# Patient Record
Sex: Female | Born: 1955 | Race: White | Hispanic: No | Marital: Single | Smoking: Current every day smoker
Health system: Southern US, Community
[De-identification: ages and names within clinical notes are randomized; demographics above are authoritative.]

## PROBLEM LIST (undated history)

## (undated) DIAGNOSIS — G43709 Chronic migraine without aura, not intractable, without status migrainosus: Secondary | ICD-10-CM

## (undated) DIAGNOSIS — IMO0002 Reserved for concepts with insufficient information to code with codable children: Secondary | ICD-10-CM

---
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
## 2010-08-05 DIAGNOSIS — C50919 Malignant neoplasm of unspecified site of unspecified female breast: Secondary | ICD-10-CM | POA: Insufficient documentation

## 2019-03-27 ENCOUNTER — Encounter: Payer: Self-pay | Admitting: Physician Assistant

## 2019-03-27 ENCOUNTER — Other Ambulatory Visit: Payer: Self-pay

## 2019-03-27 ENCOUNTER — Ambulatory Visit
Admission: EM | Admit: 2019-03-27 | Discharge: 2019-03-27 | Disposition: A | Discharge status: Home / Self Care | Payer: Medicare (Managed Care) | Attending: Physician Assistant | Admitting: Physician Assistant

## 2019-03-27 DIAGNOSIS — S90424A Blister (nonthermal), right lesser toe(s), initial encounter: Secondary | ICD-10-CM | POA: Diagnosis not present

## 2019-03-27 DIAGNOSIS — L03031 Cellulitis of right toe: Secondary | ICD-10-CM | POA: Diagnosis not present

## 2019-03-27 HISTORY — DX: Reserved for concepts with insufficient information to code with codable children: IMO0002

## 2019-03-27 MED ORDER — CEPHALEXIN 500 MG PO CAPS: 500.0000 mg | ORAL_CAPSULE | Freq: Four times a day (QID) | ORAL | 0 refills | Status: DC

## 2019-03-27 NOTE — Discharge Instructions (Addendum)
Start keflex  as directed. You can remove current dressing in 24 hours. Keep wound clean and dry. You can clean gently with soap and water. Do not soak area in water. Monitor for spreading redness, increased warmth, increased swelling, fever, follow up to ED for further evaluation needed.

## 2019-03-27 NOTE — ED Triage Notes (Signed)
Pt c/o pain with swelling to rt 3rd toe. States hit it on a stool 2 days ago. No has redness streaking from toe up foot.

## 2019-03-27 NOTE — ED Provider Notes (Signed)
[facility]    CSN: [id number] Arrival date & time: 03/27/19  1930      History   Chief Complaint Chief Complaint  Patient presents with  . Toe Pain    HPI Tracey Mitchell is a 63 y.o. female.   63 year old female comes in for evaluation of right third toe pain and swelling.  States hit the toe 2 days ago with mild pain.  Distal toe was slightly erythematous.  However, throughout the past 2 days, has had increased swelling, blister, and now with streaking up the leg.  She has had increased pain to the toe without trouble moving.  States was able to self drain purulent drainage from the blister.  She denies any fever, chills, night sweats.  She has not taken anything for the symptoms.  Patient with history of breast cancer in remission.  History of osteoporosis.  Denies history of MRSA.     Past Medical History:  Diagnosis Date  . Chronic migraine     There are no active problems to display for this patient.   History reviewed. No pertinent surgical history.  OB History   No obstetric history on file.      Home Medications    Prior to Admission medications   Medication Sig Start Date End Date Taking? Authorizing Provider  cephALEXin (KEFLEX) 500 MG capsule Take 1 capsule (500 mg total) by mouth 4 (four) times daily. 03/27/19   Ok Edwards, PA-C    Family History No family history on file.  Social History Social History   Tobacco Use  . Smoking status: Current Every Day Smoker  . Smokeless tobacco: Never Used  Substance Use Topics  . Alcohol use: Not Currently  . Drug use: Not on file     Allergies   Morphine and related and Sulfa antibiotics   Review of Systems Review of Systems  Reason unable to perform ROS: See HPI as above.     Physical Exam Triage Vital Signs ED Triage Vitals  Enc Vitals Group     BP 03/27/19 1939 131/85     Pulse Rate 03/27/19 1939 95     Resp 03/27/19 1939 16     Temp 03/27/19 1939 98.3 F (36.8 C)     Temp  Source 03/27/19 1939 Oral     SpO2 03/27/19 1939 96 %     Weight --      Height --      Head Circumference --      Peak Flow --      Pain Score 03/27/19 1944 6     Pain Loc --      Pain Edu? --      Excl. in [place]? --    No data found.  Updated Vital Signs BP 131/85 (BP Location: Left Arm)   Pulse 95   Temp 98.3 F (36.8 C) (Oral)   Resp 16   SpO2 96%   Physical Exam Constitutional:      General: She is not in acute distress.    Appearance: She is well-developed. She is not diaphoretic.  HENT:     Head: Normocephalic and atraumatic.  Eyes:     Conjunctiva/sclera: Conjunctivae normal.     Pupils: Pupils are equal, round, and reactive to light.  Musculoskeletal:     Comments: Swelling, erythema, contusion to the distal third toe with blister near the nailbed. Possible purulent fluid in blister. Erythema streaking up dorsal foot, to mid shin. Tenderness to palpation of  distal toe.  Full range of motion of toe. Sensation intact. Pedal pulse 2+.   Neurological:     Mental Status: She is alert and oriented to person, place, and time.     UC Treatments / Results  Labs (all labs ordered are listed, but only abnormal results are displayed) Labs Reviewed - No data to display  EKG   Radiology No results found.  Procedures Incision and Drainage  Date/Time: 03/27/2019 10:06 PM Performed by: Ok Edwards, PA-C Authorized by: Ok Edwards, PA-C   Consent:    Consent obtained:  Verbal   Consent given by:  Patient   Risks discussed:  Bleeding, incomplete drainage, pain and infection   Alternatives discussed:  Alternative treatment Location:    Type:  Bulla   Size:  1cm x 0.5cm   Location:  Lower extremity   Lower extremity location:  Toe   Toe location:  R third toe Pre-procedure details:    Skin preparation:  Hibiclens Anesthesia (see MAR for exact dosages):    Anesthesia method: freezing spray. Procedure type:    Complexity:  Simple Procedure details:    Needle  aspiration: no     Incision types:  Single straight   Incision depth:  Dermal   Scalpel blade:  11   Wound management:  Irrigated with saline   Drainage:  Serosanguinous and purulent   Drainage amount:  Scant   Wound treatment:  Wound left open   Packing materials:  None Post-procedure details:    Patient tolerance of procedure:  Tolerated well, no immediate complications   (including critical care time)  Medications Ordered in UC Medications - No data to display  Initial Impression / Assessment and Plan / UC Course  I have reviewed the triage vital signs and the nursing notes.  Pertinent labs & imaging results that were available during my care of the patient were reviewed by me and considered in my medical decision making (see chart for details).    Patient would like to defer xray as she has had toe fractures in the past, and knows treatment options. Given does not involve great toe, ok to defer xray.  Patient tolerated procedure well. Start keflex for surrounding cellulitis. Wound care instructions given. Return precautions given. Patient expresses understanding and agrees to plan.   Final Clinical Impressions(s) / UC Diagnoses   Final diagnoses:  Cellulitis of toe of right foot   ED Prescriptions    Medication Sig Dispense Auth. Provider   cephALEXin (KEFLEX) 500 MG capsule Take 1 capsule (500 mg total) by mouth 4 (four) times daily. 28 capsule Tobin Chad, [place] 03/27/19 2210

## 2019-06-17 DIAGNOSIS — M810 Age-related osteoporosis without current pathological fracture: Secondary | ICD-10-CM | POA: Diagnosis not present

## 2019-06-17 DIAGNOSIS — G43909 Migraine, unspecified, not intractable, without status migrainosus: Secondary | ICD-10-CM | POA: Diagnosis not present

## 2019-06-17 DIAGNOSIS — R69 Illness, unspecified: Secondary | ICD-10-CM | POA: Diagnosis not present

## 2019-06-17 DIAGNOSIS — S90511A Abrasion, right ankle, initial encounter: Secondary | ICD-10-CM | POA: Diagnosis not present

## 2019-06-17 DIAGNOSIS — Z79899 Other long term (current) drug therapy: Secondary | ICD-10-CM | POA: Diagnosis not present

## 2019-06-17 DIAGNOSIS — Z6824 Body mass index (BMI) 24.0-24.9, adult: Secondary | ICD-10-CM | POA: Diagnosis not present

## 2019-06-17 DIAGNOSIS — Z23 Encounter for immunization: Secondary | ICD-10-CM | POA: Diagnosis not present

## 2019-06-17 DIAGNOSIS — Z853 Personal history of malignant neoplasm of breast: Secondary | ICD-10-CM | POA: Diagnosis not present

## 2019-07-14 DIAGNOSIS — Z853 Personal history of malignant neoplasm of breast: Secondary | ICD-10-CM | POA: Diagnosis not present

## 2019-07-28 DIAGNOSIS — L905 Scar conditions and fibrosis of skin: Secondary | ICD-10-CM | POA: Diagnosis not present

## 2019-07-28 DIAGNOSIS — N644 Mastodynia: Secondary | ICD-10-CM | POA: Diagnosis not present

## 2019-07-28 DIAGNOSIS — C50912 Malignant neoplasm of unspecified site of left female breast: Secondary | ICD-10-CM | POA: Diagnosis not present

## 2019-07-28 DIAGNOSIS — R29818 Other symptoms and signs involving the nervous system: Secondary | ICD-10-CM | POA: Diagnosis not present

## 2019-08-04 DIAGNOSIS — N644 Mastodynia: Secondary | ICD-10-CM | POA: Diagnosis not present

## 2019-08-04 DIAGNOSIS — L905 Scar conditions and fibrosis of skin: Secondary | ICD-10-CM | POA: Diagnosis not present

## 2019-08-04 DIAGNOSIS — C50912 Malignant neoplasm of unspecified site of left female breast: Secondary | ICD-10-CM | POA: Diagnosis not present

## 2019-08-04 DIAGNOSIS — R29818 Other symptoms and signs involving the nervous system: Secondary | ICD-10-CM | POA: Diagnosis not present

## 2019-08-06 DIAGNOSIS — L905 Scar conditions and fibrosis of skin: Secondary | ICD-10-CM | POA: Diagnosis not present

## 2019-08-06 DIAGNOSIS — N644 Mastodynia: Secondary | ICD-10-CM | POA: Diagnosis not present

## 2019-08-06 DIAGNOSIS — R29818 Other symptoms and signs involving the nervous system: Secondary | ICD-10-CM | POA: Diagnosis not present

## 2019-08-06 DIAGNOSIS — C50912 Malignant neoplasm of unspecified site of left female breast: Secondary | ICD-10-CM | POA: Diagnosis not present

## 2019-08-08 DIAGNOSIS — C50912 Malignant neoplasm of unspecified site of left female breast: Secondary | ICD-10-CM | POA: Diagnosis not present

## 2019-08-08 DIAGNOSIS — R29818 Other symptoms and signs involving the nervous system: Secondary | ICD-10-CM | POA: Diagnosis not present

## 2019-08-08 DIAGNOSIS — L905 Scar conditions and fibrosis of skin: Secondary | ICD-10-CM | POA: Diagnosis not present

## 2019-08-08 DIAGNOSIS — N644 Mastodynia: Secondary | ICD-10-CM | POA: Diagnosis not present

## 2019-08-11 DIAGNOSIS — C50912 Malignant neoplasm of unspecified site of left female breast: Secondary | ICD-10-CM | POA: Diagnosis not present

## 2019-08-11 DIAGNOSIS — N644 Mastodynia: Secondary | ICD-10-CM | POA: Diagnosis not present

## 2019-08-11 DIAGNOSIS — R29818 Other symptoms and signs involving the nervous system: Secondary | ICD-10-CM | POA: Diagnosis not present

## 2019-08-11 DIAGNOSIS — L905 Scar conditions and fibrosis of skin: Secondary | ICD-10-CM | POA: Diagnosis not present

## 2019-08-13 DIAGNOSIS — R29818 Other symptoms and signs involving the nervous system: Secondary | ICD-10-CM | POA: Diagnosis not present

## 2019-08-13 DIAGNOSIS — N644 Mastodynia: Secondary | ICD-10-CM | POA: Diagnosis not present

## 2019-08-13 DIAGNOSIS — L905 Scar conditions and fibrosis of skin: Secondary | ICD-10-CM | POA: Diagnosis not present

## 2019-08-13 DIAGNOSIS — C50912 Malignant neoplasm of unspecified site of left female breast: Secondary | ICD-10-CM | POA: Diagnosis not present

## 2019-08-15 DIAGNOSIS — L905 Scar conditions and fibrosis of skin: Secondary | ICD-10-CM | POA: Diagnosis not present

## 2019-08-15 DIAGNOSIS — N644 Mastodynia: Secondary | ICD-10-CM | POA: Diagnosis not present

## 2019-08-15 DIAGNOSIS — C50912 Malignant neoplasm of unspecified site of left female breast: Secondary | ICD-10-CM | POA: Diagnosis not present

## 2019-08-15 DIAGNOSIS — R29818 Other symptoms and signs involving the nervous system: Secondary | ICD-10-CM | POA: Diagnosis not present

## 2019-09-12 DIAGNOSIS — Z791 Long term (current) use of non-steroidal anti-inflammatories (NSAID): Secondary | ICD-10-CM | POA: Diagnosis not present

## 2019-09-12 DIAGNOSIS — Z720 Tobacco use: Secondary | ICD-10-CM | POA: Diagnosis not present

## 2019-09-12 DIAGNOSIS — Z79891 Long term (current) use of opiate analgesic: Secondary | ICD-10-CM | POA: Diagnosis not present

## 2019-09-12 DIAGNOSIS — R32 Unspecified urinary incontinence: Secondary | ICD-10-CM | POA: Diagnosis not present

## 2019-09-12 DIAGNOSIS — R69 Illness, unspecified: Secondary | ICD-10-CM | POA: Diagnosis not present

## 2019-09-12 DIAGNOSIS — I10 Essential (primary) hypertension: Secondary | ICD-10-CM | POA: Diagnosis not present

## 2019-09-12 DIAGNOSIS — G43909 Migraine, unspecified, not intractable, without status migrainosus: Secondary | ICD-10-CM | POA: Diagnosis not present

## 2019-09-12 DIAGNOSIS — Z803 Family history of malignant neoplasm of breast: Secondary | ICD-10-CM | POA: Diagnosis not present

## 2019-09-12 DIAGNOSIS — G4700 Insomnia, unspecified: Secondary | ICD-10-CM | POA: Diagnosis not present

## 2019-09-12 DIAGNOSIS — G8929 Other chronic pain: Secondary | ICD-10-CM | POA: Diagnosis not present

## 2019-09-16 DIAGNOSIS — R69 Illness, unspecified: Secondary | ICD-10-CM | POA: Diagnosis not present

## 2019-09-23 DIAGNOSIS — R69 Illness, unspecified: Secondary | ICD-10-CM | POA: Diagnosis not present

## 2019-09-23 DIAGNOSIS — Z6824 Body mass index (BMI) 24.0-24.9, adult: Secondary | ICD-10-CM | POA: Diagnosis not present

## 2019-09-23 DIAGNOSIS — G43909 Migraine, unspecified, not intractable, without status migrainosus: Secondary | ICD-10-CM | POA: Diagnosis not present

## 2019-09-23 DIAGNOSIS — I10 Essential (primary) hypertension: Secondary | ICD-10-CM | POA: Diagnosis not present

## 2020-04-12 DIAGNOSIS — C50912 Malignant neoplasm of unspecified site of left female breast: Secondary | ICD-10-CM

## 2021-02-16 ENCOUNTER — Other Ambulatory Visit: Payer: Self-pay | Admitting: Hematology and Oncology

## 2021-02-17 ENCOUNTER — Other Ambulatory Visit: Payer: Self-pay | Admitting: Hematology and Oncology

## 2021-02-17 ENCOUNTER — Inpatient Hospital Stay: Payer: Medicare Other | Attending: Hematology and Oncology | Admitting: Hematology and Oncology

## 2021-02-17 DIAGNOSIS — M899 Disorder of bone, unspecified: Secondary | ICD-10-CM

## 2021-02-17 MED ORDER — IBUPROFEN 800 MG PO TABS: 800.0000 mg | ORAL_TABLET | Freq: Four times a day (QID) | ORAL | 3 refills | Status: DC | PRN

## 2021-02-17 NOTE — Progress Notes (Addendum)
[facility]  [address] [place],  [place]  [address] [phone]  Clinic Day:  02/17/2021  Referring physician: Cyndi Bender, PA-C I connected with  Tracey Mitchell on 02/17/21 by telephone and verified that I am speaking with the correct person using two identifiers.   I discussed the limitations of evaluation and management by telemedicine. The patient expressed understanding and agreed to proceed.   CHIEF COMPLAINT:  CC: A 65 year old female with history of stage IIA breast cancer with newly found femur lesion for evaluation.  Current Treatment:  Diagnostics   HISTORY OF PRESENT ILLNESS:  Tracey Mitchell is a 65 y.o. 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 This was treated with left mastectomy in January 2012, and adjuvant radiation and Arimidex for a total of 4.5 years.  She received Zometa during this time as well due to severe bone loss.  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  She then underwent a left TRAM flap reconstruction, and the scar tissue from this surgery has damaged her nerves, and causes her severe pain.  She states that the pain has progressively worsened over the past 4-5 months, but is episodic, and lasts 7-10 minutes.  She notices increased frequency and duration of this pain.  She has been tried on gabapentin, and pregabalin but these were not tolerated.  She began menarche and age 89, she has had 7 pregnancies with 2 live births, and had her first child at age 72.  She has a hysterectomy at age 46, but ovaries are intact.  She was not placed on hormone replacement therapy. She has severe chronic migraines, up to 10/10, and is on multiple medications to treat this.    Bone density scan from June revealed improved osteopenia with a T-score of -2.2 of the left femur, previously -2.7.  The AP spine measures -2.1, previously -2.4.   She has not received any treatment, but is taking [employer] collagen, oral vitamin D, and a daily multivitamin.  She underwent annual mammogram and ultrasound on July 8th which was clear.   INTERVAL HISTORY:  Tracey Mitchell is being evaluated today due to new findings on imaging. She states she has been followed regularly for a history of lung nodules. On her most recent CT of the chest on 01/11/2021, they noted some changes. IMPRESSION: 1. No acute intrathoracic pathology. 2. Multiple bilateral ground-glass nodules relatively similar to prior CT. Multidisciplinary consult and further evaluation with PET-CT is recommended 3. Aortic Atherosclerosis (ICD10-I70.0).   At this time, PET was obtained for further evaluation. This revealed IMPRESSION: 1. Multiple small ground-glass opacities in the lungs bilaterally is seen on previous diagnostic chest CTs. None of these demonstrate hypermetabolism although all ower below the accepted threshold for reliable resolution on PET imaging. Additionally, well differentiated/low-grade neoplasm can be poorly FDG avid in continued close attention recommended. 2. 1.7 cm lucent and sclerotic lesion in the right femoral neck is hypermetabolic. As a metastatic deposit cannot be excluded, right hip MRI with and without contrast recommended to further evaluate. 3. Nonspecific uptake along the posterior left third rib. Continued attention to this region on follow-up recommended.   MRI of the hip was then obtained and revealed IMPRESSION: 1. Enhancing marrow replacing bone lesion located eccentrically within the intertrochanteric aspect of the right femur anteriorly measuring up to 2.0 cm. Findings are highly suspicious for bone neoplasm such as metastatic  disease or myeloma. Tissue sampling will likely be required for definitive diagnosis. 2. Linear low signal intensity component within the lesion is suspicious for developing pathologic fracture. 3. No additional marrow replacing bone  lesions. 4. Mild osteoarthritis of the right hip.  Today the patient reports she has not noted any pain to that hip and is very anxious to find out what it is. She denies any changes to her breasts or concerns for her breast disease to have returned. She has noted a nonproductive cough with some shortness of breath upon exertion. She denies chest pain. She denies fever, chills, nausea or vomiting. She denies issue with bowel or bladder. Her appetite is good and she states her weight has been stable.   REVIEW OF SYSTEMS:  Review of Systems  Constitutional: Negative for appetite change, chills, diaphoresis, fatigue, fever and unexpected weight change.  HENT:   Negative for hearing loss, lump/mass, mouth sores, nosebleeds, sore throat, tinnitus, trouble swallowing and voice change.   Eyes: Negative for eye problems and icterus.  Respiratory: Positive for cough and shortness of breath. Negative for chest tightness, hemoptysis and wheezing.   Cardiovascular: Negative for chest pain, leg swelling and palpitations.  Gastrointestinal: Negative for abdominal distention, abdominal pain, blood in stool, constipation, diarrhea, nausea, rectal pain and vomiting.  Endocrine: Negative for hot flashes.  Genitourinary: Negative for bladder incontinence, difficulty urinating, dyspareunia, dysuria, frequency, hematuria and nocturia.   Musculoskeletal: Negative for arthralgias, back pain, flank pain, gait problem, myalgias, neck pain and neck stiffness.  Skin: Negative for itching, rash and wound.  Neurological: Negative for dizziness, extremity weakness, gait problem, headaches, light-headedness, numbness, seizures and speech difficulty.  Hematological: Negative for adenopathy. Does not bruise/bleed easily.  Psychiatric/Behavioral: Negative for confusion, decreased concentration, depression, sleep disturbance and suicidal ideas. The patient is not nervous/anxious.      VITALS:  There were no vitals taken for  this visit.  Wt Readings from Last 3 Encounters:  No data found for Wt    There is no height or weight on file to calculate BMI.  Performance status (ECOG): 1 - Symptomatic but completely ambulatory  PHYSICAL EXAM:  Physical Exam  LABS:  No flowsheet data found. No flowsheet data found.   No results found for: CEA1 / No results found for: CEA1 No results found for: PSA1 No results found for: NID782 No results found for: CAN125  No results found for: TOTALPROTELP, ALBUMINELP, A1GS, A2GS, BETS, BETA2SER, GAMS, MSPIKE, SPEI No results found for: TIBC, FERRITIN, IRONPCTSAT No results found for: LDH  STUDIES:  Exam(s): [id number] CT/CT CHEST W/O CM CLINICAL DATA:  65 year old female with pulmonary nodule follow-up. Shortness of breath and cough.  EXAM: CT CHEST WITHOUT CONTRAST  TECHNIQUE: Multidetector CT imaging of the chest was performed following the standard protocol without IV contrast.  COMPARISON:  Chest CT dated 10/08/2020.  FINDINGS: Evaluation of this exam is limited in the absence of intravenous contrast.  Cardiovascular: There is no cardiomegaly or pericardial effusion. There is mild atherosclerotic calcification of the thoracic aorta. No aneurysmal dilatation. The central pulmonary arteries are grossly unremarkable on this noncontrast CT.  Mediastinum/Nodes: No hilar or mediastinal adenopathy. The esophagus and the thyroid gland are grossly unremarkable. No mediastinal fluid collection.  Lungs/Pleura: Scattered ground-glass nodules in the right upper lobe relatively similar to prior CT. The largest nodule measuring approximately 8 mm (previously 9 mm). Additional faint ground-glass nodule noted in the left upper lobe measuring approximately 4 mm (44/4) similar to prior CT. Multiple small  nodules in the right lower lobe also similar to prior CT including a 6 mm ground-glass nodule (77/4). No new consolidation. There is no pleural effusion pneumothorax.  The central airways are patent.  Upper Abdomen: Minimal thickening of the left adrenal gland similar to prior CT.  Musculoskeletal: Several biopsy clips in the left breast. Acute osseous pathology. No suspicious bone lesions.  IMPRESSION: 1. No acute intrathoracic pathology. 2. Multiple bilateral ground-glass nodules relatively similar to prior CT. Multidisciplinary consult and further evaluation with PET-CT is recommended. 3. Aortic Atherosclerosis (ICD10-I70.0).   Electronically Signed   By: Anner Crete M.D.   On: 01/12/2021 00:39  Exam(s): [id number] NM/PET SKULL BASE TO MID-THIGH CLINICAL DATA:  Initial treatment strategy for lung nodule.  EXAM: NUCLEAR MEDICINE PET SKULL BASE TO THIGH  TECHNIQUE: 13.5 mCi F-18 FDG was injected intravenously. Full-ring PET imaging was performed from the skull base to thigh after the radiotracer. CT data was obtained and used for attenuation correction and anatomic localization.  Fasting blood glucose: 112 mg/dl  COMPARISON:  Chest CT 01/11/2021  FINDINGS: Mediastinal blood pool activity: SUV max 3.2  Liver activity: SUV max NA  NECK: No hypermetabolic lymph nodes in the neck.  Incidental CT findings: none  CHEST: Multiple scattered tiny ground-glass opacities are noted in the lungs bilaterally, better demonstrated on previous diagnostic chest CTs. One of the more dominant/confluent nodules in the posterior right upper lobe measures 7 mm today (image 67/3) with SUV max = 0.9. no hypermetabolic lymphadenopathy in the chest.  Incidental CT findings: Atherosclerotic calcification is noted in the wall of the thoracic aorta.  ABDOMEN/PELVIS: No abnormal hypermetabolic activity within the liver, pancreas, adrenal glands, or spleen. No hypermetabolic lymph nodes in the abdomen or pelvis.  Incidental CT findings: There is abdominal aortic atherosclerosis without aneurysm.  SKELETON: Focal uptake identified in the lateral  supraspinatus muscle bilaterally, likely physiologic. There is noted to be a focus of hypermetabolism in the right femoral neck corresponding to a 1.7 cm lucent and sclerotic lesion visible on image 263 of series 3 demonstrating SUV max = 4.1. There is some uptake posteriorly along the left third rib without a discrete rib lesion evident.  Incidental CT findings: none  IMPRESSION: 1. Multiple small ground-glass opacities in the lungs bilaterally is seen on previous diagnostic chest CTs. None of these demonstrate hypermetabolism although all ower below the accepted threshold for reliable resolution on PET imaging. Additionally, well differentiated/low-grade neoplasm can be poorly FDG avid in continued close attention recommended. 2. 1.7 cm lucent and sclerotic lesion in the right femoral neck is hypermetabolic. As a metastatic deposit cannot be excluded, right hip MRI with and without contrast recommended to further evaluate. 3. Nonspecific uptake along the posterior left third rib. Continued attention to this region on follow-up recommended.   Electronically Signed   By: Misty Stanley M.D.   On: 01/31/2021 16:05  Electronically Signed By: Eugenie Norrie MD  Electronically Signed Date/Time: 05/09/221608 Dictate Date/Time: 01/31/21 1554  Exam(s): [id number] MRI/MRI HIP RIGHT W/WO CLINICAL DATA:  Hypermetabolic bone lesion in the right femoral neck incidentally discovered on PET-CT  EXAM: MRI OF THE RIGHT HIP WITHOUT AND WITH CONTRAST  TECHNIQUE: Multiplanar, multisequence MR imaging was performed both before and after administration of intravenous contrast.  CONTRAST:  6 mL Gadavist IV contrast  COMPARISON:  PET-CT 01/31/2021  FINDINGS: Bones/Joint/Cartilage  There is a rounded T1 hypointense, T2 heterogeneously hyperintense marrow replacing bone lesion located eccentrically within the intertrochanteric aspect of the right femur anteriorly  measuring approximately 2.0  x 1.9 x 1.0 cm (series 4, image 20; series 11, image 16). Lesion enhances on postcontrast sequences. Linear low T1/T2 signal intensity component within the lesion is suspicious for developing pathologic fracture (series 8, image 17). Overlying cortex is preserved. No extraosseous soft tissue component.  Remaining osseous structures appear within normal limits. No additional sites of fracture. No dislocation. No femoral head avascular necrosis. No additional marrow replacing bone lesions. Mild osteoarthritis of the right hip with subcortical marrow edema within the superior acetabulum. No hip joint effusion.  Ligaments  Intact.  Muscles and Tendons  The gluteal, hamstring, iliopsoas, rectus femoris, and adductor tendons appear intact without tear or significant tendinosis. Normal muscle bulk and signal intensity without edema, atrophy, or fatty infiltration.  Soft tissues  No soft tissue edema or fluid collection. No inguinal lymphadenopathy. No acute findings within the pelvis.  IMPRESSION: 1. Enhancing marrow replacing bone lesion located eccentrically within the intertrochanteric aspect of the right femur anteriorly measuring up to 2.0 cm. Findings are highly suspicious for bone neoplasm such as metastatic disease or myeloma. Tissue sampling will likely be required for definitive diagnosis. 2. Linear low signal intensity component within the lesion is suspicious for developing pathologic fracture. 3. No additional marrow replacing bone lesions. 4. Mild osteoarthritis of the right hip.  These results will be called to the ordering clinician or representative by the Radiologist Assistant, and communication documented in the PACS or [employer].   Electronically Signed   By: Davina Poke D.O.   On: 02/10/2021 10:35  Electronically Signed By: Loura Halt DO  Electronically Signed Date/Time: 05/19/221038 Dictate Date/Time: 02/10/21 0959   HISTORY:    Past Medical History:  Diagnosis Date  . Chronic migraine     No past surgical history on file.  No family history on file.  Social History:  reports that she has been smoking. She has never used smokeless tobacco. She reports previous alcohol use. No history on file for drug use.The patient is alone  today.  Allergies:  Allergies  Allergen Reactions  . Morphine And Related   . Sulfa Antibiotics     Current Medications: Current Outpatient Medications  Medication Sig Dispense Refill  . cephALEXin (KEFLEX) 500 MG capsule Take 1 capsule (500 mg total) by mouth 4 (four) times daily. 28 capsule 0   No current facility-administered medications for this visit.     ASSESSMENT & PLAN:   Assessment:  Tracey Mitchell is a 65 y.o. female with newly found femur lesion which is PET positive. This was found incidentally while evaluating lung nodules, for which the patient has a history. She also has a remote history of stage IIA breast cancer diagnosed in 2011 that was treated with left breast mastectomy and adjuvant radiation followed by 4.5 years of arimidex therapy. She is agreeable to further diagnostic evaluation of the femur lesion including biopsy whether with IR or orthopedic surgery.  Plan: After speaking with Dr. Kathlene Cote with [facility], it has been determined that the safer route for the patient would be to pursue further evaluation with orthopedic oncology. We will refer the patient to Dr. Magda Bernheim at [facility].   She verbalizes understanding of and agreement to the plans discussed today. She knows to call the office should any new questions or concerns arise.      Melodye Ped, NP    Patient: Home Provider: Office Time spent on telephone visit: 30 minutes

## 2021-03-03 DIAGNOSIS — Z853 Personal history of malignant neoplasm of breast: Secondary | ICD-10-CM | POA: Insufficient documentation

## 2021-06-01 ENCOUNTER — Telehealth: Payer: Self-pay | Admitting: Oncology

## 2021-06-01 NOTE — Telephone Encounter (Signed)
Per 8/26 Staff Msg, patient scheduled for 9/9 Follow Up Appt

## 2021-06-03 ENCOUNTER — Inpatient Hospital Stay: Payer: Medicare (Managed Care) | Admitting: Oncology

## 2021-06-03 ENCOUNTER — Encounter: Payer: Self-pay | Admitting: Oncology

## 2021-06-03 DIAGNOSIS — M8580 Other specified disorders of bone density and structure, unspecified site: Secondary | ICD-10-CM | POA: Insufficient documentation

## 2021-06-13 NOTE — Progress Notes (Signed)
[facility]  [address] [place],  [place]  [address] [phone]  Clinic Day:  06/20/2021  Referring physician: Darrol Jump, NP   This document serves as a record of services personally performed by Hosie Poisson, MD. It was created on their behalf by Curry,Lauren E, a trained medical scribe. The creation of this record is based on the scribe's personal observations and the provider's statements to them.  CHIEF COMPLAINT:  CC: History of stage IIA breast cancer with newly found femur lesion for evaluation.  Current Treatment:  Diagnostics   HISTORY OF PRESENT ILLNESS:  Tracey Mitchell is a 65 y.o. 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 This was treated with left mastectomy in January 2012, and adjuvant radiation and Arimidex for a total of 4.5 years.  She received Zometa during this time as well due to severe bone loss.  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  She then underwent a left TRAM flap reconstruction, and the scar tissue from this surgery has damaged her nerves, and causes her severe pain.  She states that the pain has progressively worsened over the past 4-5 months, but is episodic, and lasts 7-10 minutes.  She notices increased frequency and duration of this pain.  She has been tried on gabapentin, and pregabalin but these were not tolerated.  She began menarche and age 73, she has had 7 pregnancies with 2 live births, and had her first child at age 47.  She has a hysterectomy at age 3, but ovaries are intact.  She was not placed on hormone replacement therapy. She has severe chronic migraines, up to 10/10, and is on multiple medications to treat this.    Bone density scan from June 2021 revealed improved osteopenia with a T-score of -2.2 of the left femur, previously -2.7.  The AP spine measures -2.1, previously -2.4.  She  received Zometa while on anastrozole, but is now taking [employer] collagen, oral vitamin D, and a daily multivitamin.    CT chest from April 2022 revealed no acute intrathoracic pathology. Multiple bilateral ground-glass nodules relatively similar to prior CT. Multidisciplinary consult and further evaluation with PET-CT is recommended. PET was obtained for further evaluation. This revealed multiple small ground-glass opacities in the lungs bilaterally is seen on previous diagnostic chest CTs. None of these demonstrate hypermetabolism although all ower below the accepted threshold for reliable resolution on PET imaging. Additionally, well differentiated/low-grade neoplasm can be poorly FDG avid in continued close attention recommended. A 1.7 cm lucent and sclerotic lesion in the right femoral neck is hypermetabolic. As a metastatic deposit cannot be excluded, right hip MRI with and without contrast recommended to further evaluate. Nonspecific uptake along the posterior left third rib. Continued attention to this region on follow-up recommended. MRI of the hip was then obtained and revealed enhancing marrow replacing bone lesion located eccentrically within the intertrochanteric aspect of the right femur anteriorly measuring up to 2.0 cm. Linear low signal intensity component within the lesion is suspicious for developing pathologic fracture. No additional marrow replacing bone lesions. Mild osteoarthritis of the right hip. She was evaluated by Dr. Magda Bernheim, orthopedic oncologist at [facility]. He feels this may be a healing fracture and is not highly suspicious of malignancy, but will monitor it.    INTERVAL HISTORY:  Tracey Mitchell is here for routine follow up and continues to follow up with  Dr. Magda Bernheim, orthopedic oncologist, and last saw him on September 20th. X-ray imaging revealed slightly increased conspicuity of previously described sclerotic lesion in the right trochanteric region compared to 03/01/2021  right hip radiograph. No acute fracture or malalignment. No pelvic ring injury. He feels the lesion of the hip is unlikely to be breast cancer metastasis, but would like to repeat an MRI to ensure the lesion has not changed, and has scheduled this for November 29th. She has been placed on antidepressants with Lexapro and Wellbutrin. She still has occasional left breast pain due to scarring. She underwent annual mammogram and ultrasound on July 2022 which was clear. She does have chronic migraine headache for the past 12 years. She has been on multiple medications with toxicities. Currently she is taking a combination of baclofen 10 mg, alprazolam 1 mg, diclofenac 50 mg, and tramadol 50 mg daily with some improvement. She states that she needs a referral for a neurologist, and we will make the appropriate referral to Dr. Thornell Mule of [facility].  Her  appetite is good, and she has gained 3 pounds since her last visit.  She denies fever, chills or other signs of infection.  She denies nausea, vomiting, bowel issues, or abdominal pain.  She denies sore throat, cough, dyspnea, or chest pain.  REVIEW OF SYSTEMS:  Review of Systems  Constitutional: Negative.  Negative for appetite change, chills, fatigue, fever and unexpected weight change.  HENT:  Negative.    Eyes: Negative.   Respiratory: Negative.  Negative for chest tightness, cough, hemoptysis, shortness of breath and wheezing.   Cardiovascular: Negative.  Negative for chest pain, leg swelling and palpitations.  Gastrointestinal: Negative.  Negative for abdominal distention, abdominal pain, blood in stool, constipation, diarrhea, nausea and vomiting.  Endocrine: Negative.   Genitourinary: Negative.  Negative for difficulty urinating, dysuria, frequency and hematuria.   Musculoskeletal: Negative.  Negative for arthralgias, back pain, flank pain, gait problem and myalgias.       Intermittent left breast pain from scarring  Skin: Negative.   Neurological:   Positive for headaches (migraines, chronic). Negative for dizziness, extremity weakness, gait problem, light-headedness, numbness, seizures and speech difficulty.  Hematological: Negative.   Psychiatric/Behavioral:  Positive for depression. Negative for sleep disturbance. The patient is nervous/anxious.     VITALS:  Blood pressure 111/66, pulse 69, temperature 98 F (36.7 C), temperature source Oral, resp. rate 18, height '5\' 2"'$  (1.575 m), weight 136 lb 1.6 oz (61.7 kg), SpO2 98 %.  Wt Readings from Last 3 Encounters:  06/20/21 136 lb 1.6 oz (61.7 kg)    Body mass index is 24.89 kg/m.  Performance status (ECOG): 1 - Symptomatic but completely ambulatory  PHYSICAL EXAM:  Physical Exam Constitutional:      General: She is not in acute distress.    Appearance: Normal appearance. She is normal weight.  HENT:     Head: Normocephalic and atraumatic.  Eyes:     General: No scleral icterus.    Extraocular Movements: Extraocular movements intact.     Conjunctiva/sclera: Conjunctivae normal.     Pupils: Pupils are equal, round, and reactive to light.  Cardiovascular:     Rate and Rhythm: Normal rate and regular rhythm.     Pulses: Normal pulses.     Heart sounds: Normal heart sounds. No murmur heard.   No friction rub. No gallop.  Pulmonary:     Effort: Pulmonary effort is normal. No respiratory distress.     Breath sounds: Normal breath  sounds.  Chest:     Comments: Generalized scarring across the entire mid left breast and a deep scar at the lower inner quadrant. Abdominal:     General: Bowel sounds are normal. There is no distension.     Palpations: Abdomen is soft. There is no hepatomegaly, splenomegaly or mass.     Tenderness: There is no abdominal tenderness.  Musculoskeletal:        General: Normal range of motion.     Cervical back: Normal range of motion and neck supple.     Right lower leg: No edema.     Left lower leg: No edema.  Lymphadenopathy:     Cervical: No  cervical adenopathy.  Skin:    General: Skin is warm and dry.  Neurological:     General: No focal deficit present.     Mental Status: She is alert and oriented to person, place, and time. Mental status is at baseline.  Psychiatric:        Mood and Affect: Mood normal.        Behavior: Behavior normal.        Thought Content: Thought content normal.        Judgment: Judgment normal.    LABS:  No flowsheet data found. No flowsheet data found.  STUDIES:   X-RAY PELVIS (1-2 VIEWS), 06/14/2021 2:06 PM   INDICATION: lesion \ M89.9 Lesion of right femur  COMPARISON: 04/07/2021 hip MRI, 03/01/2021 right hip radiograph.   CONCLUSION:   1.  Slightly increased conspicuity of previously described sclerotic lesion in the right trochanteric region compared to 03/01/2021 right hip radiograph.  2.  No acute fracture or malalignment. No pelvic ring injury.  3.  Both hips are located.  4.  Joint spaces are maintained.  MR HIP RIGHT WO CONTRAST, 04/07/2021 12:53 PM   INDICATION:Musculoskeletal neoplasm, surveillance \ M89.9 Lesion of right femur  COMPARISON: Right femoral radiographs 03/01/2021. Outside MRI from 02/08/2021.   TECHNIQUE: Multi-planar, multi-sequence MR imaging of the right hip was performed without contrast.   FINDINGS:   PELVIS:  Anterior pelvic ring: Within normal limits.  Posterior pelvic ring: Within normal limits.  Left hip/proximal femur: Examination is not tailored to evaluate the internal structures of the left hip. No gross abnormality. Small amount of left trochanteric bursitis.  Soft tissues (extra-pelvic): Within normal limits.  Viscera: Within normal limits.   RIGHT HIP/PROXIMAL FEMUR:  Femur: There is a T1 weighted hypointense/T2 weighted hyperintense lesion within the anterior intertrochanteric region of the right femur measuring 2.1 x 2.1 x 1.0 cm (CC, transverse, and AP dimensions).  Hip joint: No effusion or malalignment.  Labrum: Within normal limits.  Soft  tissues (peri-articular): No abnormal masses or fluid collections.   Additional comments: None.   CONCLUSION:   Focal area of abnormal signal intensity along the anterior aspect of the right intertrochanteric region. When reviewed in conjunction with prior MRI from 02/08/2021, these findings are favored to relate to the evolution of an underlying stress fracture in this location with surrounding inflammation; however, the findings are slightly atypical and an underlying lesion, possibly metastases, is not excluded. Would recommend close interval follow-up (3 months) with MRI to ensure resolution.  DIAGNOSTIC BILATERAL MAMMOGRAM W/CAD: 04/18/2021 IMPRESSION:  No mammographic evidence of malignancy.  HISTORY:   Allergies:  Allergies  Allergen Reactions   Morphine     Other reaction(s): Other (See Comments) Hyper    Morphine And Related    Sulfa Antibiotics Swelling   Covid-19 (Mrna)  Vaccine Rash    [employer] and J&J   Tape Rash    Current Medications: Current Outpatient Medications  Medication Sig Dispense Refill   baclofen (LIORESAL) 10 MG tablet TAKE 1 TABLET BY MOUTH TWICE A DAY FOR HEADACHES/PAIN     losartan-hydrochlorothiazide (HYZAAR) 100-25 MG tablet TAKE 1 (ONE) TABLET DAILY FOR BLOOD PRESSURE     albuterol (VENTOLIN HFA) 108 (90 Base) MCG/ACT inhaler SMARTSIG:2 Puff(s) By Mouth Every 4-6 Hours PRN     ALPRAZolam (XANAX) 1 MG tablet Take 0.5-1 mg by mouth 3 (three) times daily as needed.     buPROPion (WELLBUTRIN SR) 150 MG 12 hr tablet Take 150 mg by mouth daily.     diclofenac (VOLTAREN) 50 MG EC tablet      escitalopram (LEXAPRO) 10 MG tablet Take 10 mg by mouth daily.     metoprolol succinate (TOPROL-XL) 100 MG 24 hr tablet Take 100 mg by mouth daily.     Multiple Vitamin (MULTIVITAMIN) capsule Take 1 capsule by mouth daily.     traMADol (ULTRAM) 50 MG tablet Take 50 mg by mouth daily.     No current facility-administered medications for this visit.     ASSESSMENT  & PLAN:   Assessment:   Remote history of stage IIA breast cancer diagnosed in 2011 that was treated with left breast mastectomy and adjuvant radiation followed by 4.5 years of arimidex therapy.   Newly found femur lesion which is PET positive. This was found incidentally while evaluating lung nodules, for which the patient has a history. She was seen by Dr. Redmond Pulling of orthopedic oncology who did not feel this represented breast cancer metastasis. He will repeat MRI imaging in November to ensure the lesion has not changed.   3.   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 She is now taking collagen, oral vitamin D, and a daily multivitamin.  She will be due for repeat bone density in June 2023. If needed, we could consider treatment with miacalcin.  4.   Chronic migraine for the past 12 years. She has been on multiple medications with toxicities. Currently she is taking a combination of baclofen 10 mg, alprazolam 1 mg, diclofenac 50 mg, and tramadol 50 mg daily with some improvement. We will make the appropriate referral to a neurologist, Dr. Thornell Mule of [facility] as requested.  Plan: We will draw labs today, and I will call her with the results. She continues to follow with Dr. Redmond Pulling, orthopedic surgery, and is scheduled for repeat MRI imaging in November. If needed we can consider treatment with miacalcin. As above, we will make the appropriate referral to a neurologist. She will continue annual screening mammograms at [facility]. Otherwise, we will see her back in 1 year with CBC, CMP and bone density for repeat evaluation. She verbalizes understanding of and agreement to the plans discussed today. She knows to call the office should any new questions or concerns arise.   I provided 25 minutes of face-to-face time during this this encounter and > 50% was spent counseling as documented under my assessment and  plan.    I, Rita Ohara, am acting as scribe for Derwood Kaplan, MD  I have reviewed this report as typed by the medical scribe, and it is complete and accurate.

## 2021-06-20 ENCOUNTER — Telehealth: Payer: Self-pay | Admitting: Oncology

## 2021-06-20 ENCOUNTER — Encounter: Payer: Self-pay | Admitting: Oncology

## 2021-06-20 ENCOUNTER — Other Ambulatory Visit: Payer: Self-pay | Admitting: Hematology and Oncology

## 2021-06-20 ENCOUNTER — Inpatient Hospital Stay: Payer: Medicare Other

## 2021-06-20 ENCOUNTER — Inpatient Hospital Stay: Payer: Medicare Other | Attending: Oncology | Admitting: Oncology

## 2021-06-20 ENCOUNTER — Other Ambulatory Visit: Payer: Self-pay | Admitting: Oncology

## 2021-06-20 DIAGNOSIS — C50912 Malignant neoplasm of unspecified site of left female breast: Secondary | ICD-10-CM

## 2021-06-20 DIAGNOSIS — M8580 Other specified disorders of bone density and structure, unspecified site: Secondary | ICD-10-CM

## 2021-06-20 DIAGNOSIS — Z170 Estrogen receptor positive status [ER+]: Secondary | ICD-10-CM

## 2021-06-20 DIAGNOSIS — M899 Disorder of bone, unspecified: Secondary | ICD-10-CM

## 2021-06-20 LAB — BASIC METABOLIC PANEL
BUN: 20 (ref 4–21)
CO2: 23 — AB (ref 13–22)
Chloride: 103 (ref 99–108)
Creatinine: 0.9 (ref 0.5–1.1)
Glucose: 101
Potassium: 4.2 (ref 3.4–5.3)
Sodium: 137 (ref 137–147)

## 2021-06-20 LAB — CBC AND DIFFERENTIAL
HCT: 39 (ref 36–46)
Hemoglobin: 12.7 (ref 12.0–16.0)
Neutrophils Absolute: 3.46
Platelets: 352 (ref 150–399)
WBC: 6.4

## 2021-06-20 LAB — CBC: RBC: 4.33 (ref 3.87–5.11)

## 2021-06-20 LAB — HEPATIC FUNCTION PANEL
ALT: 16 (ref 7–35)
AST: 29 (ref 13–35)
Alkaline Phosphatase: 76 (ref 25–125)
Bilirubin, Total: 0.4

## 2021-06-20 LAB — COMPREHENSIVE METABOLIC PANEL
Albumin: 4.2 (ref 3.5–5.0)
Calcium: 9.3 (ref 8.7–10.7)

## 2021-06-20 NOTE — Telephone Encounter (Signed)
Per 9/26 LOS, patient scheduled for July 2023 Appt's - Faxed DEXA Order to Centralized Scheduling for 03/23/22 Appt

## 2021-06-23 ENCOUNTER — Telehealth: Payer: Self-pay

## 2021-06-23 NOTE — Telephone Encounter (Signed)
-----   Message from Derwood Kaplan, MD sent at 06/20/2021 12:46 PM EDT ----- Regarding: call pt Tell her all labs are normal, incl. BS 101

## 2021-06-23 NOTE — Telephone Encounter (Signed)
Patient notified

## 2021-10-13 DIAGNOSIS — C7951 Secondary malignant neoplasm of bone: Secondary | ICD-10-CM | POA: Insufficient documentation

## 2021-11-07 NOTE — Progress Notes (Signed)
[facility]  [address] [place],  [place]  [address] [phone]  Clinic Day:  11/11/2021  Referring physician: Darrol Jump, NP   This document serves as a record of services personally performed by Hosie Poisson, MD. It was created on their behalf by Curry,Lauren E, a trained medical scribe. The creation of this record is based on the scribe's personal observations and the provider's statements to them.  CHIEF COMPLAINT:  CC: History of stage IIA breast cancer with newly found femur lesion for evaluation.  Current Treatment:  Diagnostics   HISTORY OF PRESENT ILLNESS:  Tracey Mitchell is a 66 y.o. 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 This was treated with left mastectomy in January 2012, and adjuvant radiation and Arimidex for a total of 4.5 years.  She received Zometa during this time as well due to severe bone loss.  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  She then underwent a left TRAM flap reconstruction, and the scar tissue from this surgery has damaged her nerves, and causes her severe pain.  She states that the pain progressively worsened, but is episodic, and lasts 7-10 minutes.  She has been tried on gabapentin, and pregabalin but these were not tolerated.  She began menarche and age 10, she has had 7 pregnancies with 2 live births, and had her first child at age 51.  She has a hysterectomy at age 20, but ovaries are intact.  She was not placed on hormone replacement therapy. She has severe chronic migraines, up to 10/10, and is on multiple medications to treat this, with a 'cocktail' of baclofen 10 mg, alprazolam 1 mg, diclofenac 50 mg, and tramadol 50 mg daily with some improvement..    Bone density scan from June 2021 revealed improved osteopenia with a T-score of -2.2 of the left femur, previously -2.7.  The AP spine measures  -2.1, previously -2.4.  She received Zometa while on anastrozole, but is now taking [employer] collagen, oral vitamin D, and a daily multivitamin. Currently she is taking a combination of baclofen 10 mg, alprazolam 1 mg, diclofenac 50 mg, and tramadol 50 mg daily for migraines with some improvement.  CT chest from April 2022 revealed no acute intrathoracic pathology. Multiple bilateral ground-glass nodules relatively similar to prior CT. PET revealed multiple small ground-glass opacities in the lungs bilaterally as seen on previous diagnostic chest CTs. None of these demonstrate hypermetabolism although all ower below the accepted threshold for reliable resolution on PET imaging. Additionally, well differentiated/low-grade neoplasm can be poorly FDG avid and continued close attention was recommended. A 1.7 cm lucent and sclerotic lesion in the right femoral neck is hypermetabolic. As a metastatic deposit cannot be excluded, right hip MRI with and without contrast was recommended and revealed enhancing marrow replacing bone lesion located eccentrically within the intertrochanteric aspect of the right femur anteriorly measuring up to 2.0 cm. Linear low signal intensity component within the lesion was suspicious for developing pathologic fracture. 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 She was evaluated by Dr. Magda Bernheim, orthopedic oncologist at [facility]. He feels this may be a healing fracture and is not highly suspicious of malignancy, but will monitor it. There was also nonspecific uptake along the posterior left third rib.     INTERVAL HISTORY:  Tracey Mitchell is here for follow  up to discuss a treatment plan. Repeat MRI of the right hip from January 3rd revealed interval increase in size of the osseous lesion located along the anterior aspect of the right intertrochanteric region from 2.1 cm to 3.4 cm. A smaller additional 8 mm lesion was  also noted within the left posterior ilium. These findings were concerning for an osseous metastases so Dr. Magda Bernheim did pursue a biopsy of the bone. 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 Estrogen receptor was positive at 95% and progesterone receptor was positive at 5%. HER2 was negative (1+) and Ki67 was 15%. She notes pain of the right lower back and buttock which radiates down her right thigh. She has been using Motrin 800 mg every 8 hours, but this only gives her relief for 2 hours. She states that tramadol has helped with her pain, but she has had to use 100 mg at a time. I will send in a new prescription for tramadol 100 mg to use as needed up to 4 daily. She has been understandably anxious about her diagnosis. Blood counts and chemistries are unremarkable. Her  appetite is good, and she has gained 3 and 1/2 pounds since her last visit.  She denies fever, chills or other signs of infection.  She denies nausea, vomiting, bowel issues, or abdominal pain.  She denies sore throat, cough, dyspnea, or chest pain.   REVIEW OF SYSTEMS:  Review of Systems  Constitutional:  Positive for fatigue. Negative for appetite change, chills, fever and unexpected weight change.  HENT:  Negative.    Eyes: Negative.   Respiratory: Negative.  Negative for chest tightness, cough, hemoptysis, shortness of breath and wheezing.   Cardiovascular: Negative.  Negative for chest pain, leg swelling and palpitations.  Gastrointestinal: Negative.  Negative for abdominal distention, abdominal pain, blood in stool, constipation, diarrhea, nausea and vomiting.  Endocrine: Negative.   Genitourinary: Negative.  Negative for difficulty urinating, dysuria, frequency and hematuria.   Musculoskeletal:  Negative for arthralgias, back pain, flank pain, gait problem and myalgias.       Pain of the right lower back and hip which radiates down her right thigh  Skin: Negative.    Neurological:  Positive for headaches (migraines, chronic). Negative for dizziness, extremity weakness, gait problem, light-headedness, numbness, seizures and speech difficulty.  Hematological: Negative.   Psychiatric/Behavioral:  Negative for depression and sleep disturbance. The patient is nervous/anxious.     VITALS:  Blood pressure (!) 168/82, pulse 66, temperature 98 F (36.7 C), temperature source Oral, resp. rate 18, height 5' 2"  (1.575 m), weight 139 lb 12.8 oz (63.4 kg), SpO2 98 %.  Wt Readings from Last 3 Encounters:  11/11/21 139 lb 12.8 oz (63.4 kg)  06/20/21 136 lb 1.6 oz (61.7 kg)    Body mass index is 25.57 kg/m.  Performance status (ECOG): 1 - Symptomatic but completely ambulatory  PHYSICAL EXAM:  Physical Exam Constitutional:      General: She is not in acute distress.    Appearance: Normal appearance. She is normal weight.  HENT:     Head: Normocephalic and atraumatic.  Eyes:     General: No scleral icterus.    Extraocular Movements: Extraocular movements intact.     Conjunctiva/sclera: Conjunctivae normal.     Pupils: Pupils are equal, round, and reactive to light.  Cardiovascular:     Rate and Rhythm: Normal rate and regular rhythm.     Pulses: Normal pulses.     Heart  sounds: Normal heart sounds. No murmur heard.   No friction rub. No gallop.  Pulmonary:     Effort: Pulmonary effort is normal. No respiratory distress.     Breath sounds: Rhonchi (occasional, scattered and expiratory) present.  Chest:     Comments: Left reconstruction has some firmness across the mid breast and healing incisions below the breast. Right breast is without masses. Abdominal:     General: Bowel sounds are normal. There is no distension.     Palpations: Abdomen is soft. There is no hepatomegaly, splenomegaly or mass.     Tenderness: There is no abdominal tenderness.  Musculoskeletal:        General: Normal range of motion.     Cervical back: Normal range of motion and neck  supple.     Right lower leg: No edema.     Left lower leg: No edema.  Lymphadenopathy:     Cervical: No cervical adenopathy.  Skin:    General: Skin is warm and dry.  Neurological:     General: No focal deficit present.     Mental Status: She is alert and oriented to person, place, and time. Mental status is at baseline.  Psychiatric:        Mood and Affect: Mood normal.        Behavior: Behavior normal.        Thought Content: Thought content normal.        Judgment: Judgment normal.    LABS:   CBC Latest Ref Rng & Units 11/11/2021 06/20/2021  WBC - 7.0 6.4  Hemoglobin 12.0 - 16.0 13.5 12.7  Hematocrit 36 - 46 39 39  Platelets 150 - 399 319 352   CMP Latest Ref Rng & Units 11/11/2021 06/20/2021  BUN 4 - 21 16 20   Creatinine 0.5 - 1.1 0.7 0.9  Sodium 137 - 147 136(A) 137  Potassium 3.4 - 5.3 4.5 4.2  Chloride 99 - 108 102 103  CO2 13 - 22 26(A) 23(A)  Calcium 8.7 - 10.7 8.9 9.3  Alkaline Phos 25 - 125 79 76  AST 13 - 35 34 29  ALT 7 - 35 26 16    STUDIES:   MR HIP RIGHT WO CONTRAST, 09/27/2021 9:10 AM   INDICATION:Right hip pain \ M89.9 Lesion of right femur  COMPARISON: MRI right hip 04/07/2021.   TECHNIQUE: Multi-planar, multi-sequence MR imaging of the right hip was performed without contrast.   FINDINGS:   PELVIS:  Anterior pelvic ring: Within normal limits.  Posterior pelvic ring: An 8 mm T1 hypointense/T2 weighted hyperintense lesion is seen within the left posterior ilium (series 5, image 9 and series 6, image 8). Additional 5 mm focus of T2 hyperintense signal seen within the right ilium adjacent to the sacroiliac joint, which is too small to characterize and may represent degenerative changes.  Left hip/proximal femur: Examination is not tailored to evaluate the internal structures of the left hip. No gross abnormality.  Soft tissues (extra-pelvic): Small amount of left trochanteric bursitis.  Viscera: Within normal limits. Status post hysterectomy.   RIGHT  HIP/PROXIMAL FEMUR:  Femur: Interval increase in size of the T1 weighted hypointense/T2 weighted hyperintense lesion within the anterior intertrochanteric region of the right femur which measures 3.4 x 1.2 x 2.9 cm in CC, AP, transverse dimensions (previously measuring 2.1 x 1.6 x 2.1 cm). No evidence of cortical breakthrough.  Hip joint: No effusion or malalignment.  Labrum: Within normal limits.  Soft tissues (peri-articular): No abnormal masses  or fluid collections.   Additional comments: Mild facet arthropathy of L5-S1. Mild bilateral sacroiliac degenerative changes.   CONCLUSION:   Interval increase in size of the osseous lesion located along the anterior aspect of the right intertrochanteric region. Smaller additional 8 mm lesion is also noted within the left posterior ilium. These findings are concerning for an osseous metastases.  CYTOLOGY, FNA: 10/13/2021 Specimen A.  Final Interpretation    BONE, RIGHT FEMUR, FINE NEEDLE ASPIRATION, CYTOLOGY (SMEARS AND CELL BLOCK):              Rare atypical cells are present, suspicious for carcinoma.   Specimen B. Final Interpretation    BONE, RIGHT FEMUR, NEEDLE BIOPSY:              Adenocarcinoma, consistent with breast origin.              See comment.    HISTORY:   Allergies:  Allergies  Allergen Reactions   Morphine     Other reaction(s): Other (See Comments) Hyper    Morphine And Related    Sulfa Antibiotics Swelling   Covid-19 (Mrna) Vaccine Rash    Pfizer and J&J   Tape Rash    Current Medications: Current Outpatient Medications  Medication Sig Dispense Refill   ibuprofen (ADVIL) 800 MG tablet Take 800 mg by mouth every 8 (eight) hours as needed.     albuterol (VENTOLIN HFA) 108 (90 Base) MCG/ACT inhaler SMARTSIG:2 Puff(s) By Mouth Every 4-6 Hours PRN     ALPRAZolam (XANAX) 1 MG tablet Take 0.5-1 mg by mouth 3 (three) times daily as needed.     baclofen (LIORESAL) 10 MG tablet TAKE 1 TABLET BY MOUTH TWICE A DAY FOR  HEADACHES/PAIN     diclofenac (VOLTAREN) 50 MG EC tablet      losartan-hydrochlorothiazide (HYZAAR) 100-25 MG tablet TAKE 1 (ONE) TABLET DAILY FOR BLOOD PRESSURE     metoprolol succinate (TOPROL-XL) 100 MG 24 hr tablet Take 100 mg by mouth daily.     Multiple Vitamin (MULTIVITAMIN) capsule Take 1 capsule by mouth daily.     traMADol (ULTRAM) 50 MG tablet Take 50 mg by mouth daily.     No current facility-administered medications for this visit.     ASSESSMENT & PLAN:   Assessment:   Remote history of stage IIA breast cancer diagnosed in 2011 that was treated with left breast mastectomy and adjuvant radiation followed by 4.5 years of arimidex therapy.   Osseous metastasis, January 2023. MRI imaging from January revealed interval increase in the right intertrochanteric lesion now measuring 3.4 cm, previously 2.1 cm, and pathology has confirmed adenocarcinoma, consistent with breast origin. Hormone receptors were positive. A smaller additional 8 mm lesion was also noted within the left posterior ilium. I think she needs a PET scan to evaluate for other osseous lesions and complete her staging. In the meantime, we will start her on letrozole 2.5 mg daily and plan to add palbociclib 125 mg.   3.   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 She is taking collagen, oral vitamin D, and a daily multivitamin.  She will be due for repeat bone density in June 2023. If needed, we could consider treatment with miacalcin.  4.   Chronic migraine for the past 12 years. She has been on multiple medications with toxicities. Currently she is taking a combination of baclofen 10 mg, alprazolam 1 mg, diclofenac 50 mg,  and tramadol 50 mg daily with some improvement. We made the referral to a neurologist, Dr. Thornell Mule of [facility] as requested.  5.  History of nonspecific pulmonary nodules. CT chest from December 2022 remained  stable. She continues to follow with Dr. Nila Nephew.  6.  Pain of the right lower back and hip which radiates down her right leg. 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 This has not been well controlled with Motrin, but is improved with tramadol. I will send in a new prescription for tramadol 100 mg as needed up to 4 daily and Motrin 800 mg as needed.   Plan: In view of her new osseous metastasis, we reviewed treatment options including hormonal therapy with letrozole 2.5 mg daily along with oral chemotherapy with palbociclib 125 mg for 21 days on and 7 days off. The schedule and potential toxicities were reviewed, and she will undergo education with an oncology pharmacist. I will go ahead and send in the prescription for letrozole today. Repeat PET imaging will also be obtained to rule out other areas of metastatic disease. We will plan to see her back in 3 weeks with CBC and CMP for repeat evaluation. She will be due for repeat bone density in June 2023. As she has had osteonecrosis of the jaw with Zometa, we would not be able to go with alendronate or denosumab. However, we could consider other medications if needed, but hopefully can just monitor her bone denstiy. She verbalizes understanding of and agreement to the plans discussed today. She knows to call the office should any new questions or concerns arise.   I provided 40 minutes of face-to-face time during this this encounter and > 50% was spent counseling as documented under my assessment and plan.    I, Rita Ohara, am acting as scribe for Derwood Kaplan, MD  I have reviewed this report as typed by the medical scribe, and it is complete and accurate.

## 2021-11-10 ENCOUNTER — Other Ambulatory Visit: Payer: Self-pay | Admitting: Oncology

## 2021-11-10 DIAGNOSIS — C7951 Secondary malignant neoplasm of bone: Secondary | ICD-10-CM

## 2021-11-11 ENCOUNTER — Encounter: Payer: Self-pay | Admitting: Oncology

## 2021-11-11 ENCOUNTER — Inpatient Hospital Stay: Payer: Medicare Other | Attending: Oncology | Admitting: Oncology

## 2021-11-11 ENCOUNTER — Other Ambulatory Visit (HOSPITAL_COMMUNITY): Payer: Self-pay

## 2021-11-11 ENCOUNTER — Other Ambulatory Visit: Payer: Self-pay

## 2021-11-11 ENCOUNTER — Other Ambulatory Visit: Payer: Self-pay | Admitting: Oncology

## 2021-11-11 ENCOUNTER — Inpatient Hospital Stay: Payer: Medicare Other

## 2021-11-11 ENCOUNTER — Other Ambulatory Visit: Payer: Self-pay | Admitting: Hematology and Oncology

## 2021-11-11 VITALS — BP 168/82 | HR 66 | Temp 98.0°F | Resp 18 | Ht 62.0 in | Wt 139.8 lb

## 2021-11-11 DIAGNOSIS — M8580 Other specified disorders of bone density and structure, unspecified site: Secondary | ICD-10-CM | POA: Diagnosis not present

## 2021-11-11 DIAGNOSIS — Z853 Personal history of malignant neoplasm of breast: Secondary | ICD-10-CM | POA: Insufficient documentation

## 2021-11-11 DIAGNOSIS — M899 Disorder of bone, unspecified: Secondary | ICD-10-CM

## 2021-11-11 DIAGNOSIS — C7951 Secondary malignant neoplasm of bone: Secondary | ICD-10-CM

## 2021-11-11 LAB — CBC: RBC: 4.36 (ref 3.87–5.11)

## 2021-11-11 LAB — HEPATIC FUNCTION PANEL
ALT: 26 (ref 7–35)
AST: 34 (ref 13–35)
Alkaline Phosphatase: 79 (ref 25–125)
Bilirubin, Total: 0.5

## 2021-11-11 LAB — BASIC METABOLIC PANEL
BUN: 16 (ref 4–21)
CO2: 26 — AB (ref 13–22)
Chloride: 102 (ref 99–108)
Creatinine: 0.7 (ref 0.5–1.1)
Glucose: 99
Potassium: 4.5 (ref 3.4–5.3)
Sodium: 136 — AB (ref 137–147)

## 2021-11-11 LAB — CBC AND DIFFERENTIAL
HCT: 39 (ref 36–46)
Hemoglobin: 13.5 (ref 12.0–16.0)
Neutrophils Absolute: 3.36
Platelets: 319 (ref 150–399)
WBC: 7

## 2021-11-11 LAB — COMPREHENSIVE METABOLIC PANEL
Albumin: 4.3 (ref 3.5–5.0)
Calcium: 8.9 (ref 8.7–10.7)

## 2021-11-11 MED ORDER — LETROZOLE 2.5 MG PO TABS: 2.5000 mg | ORAL_TABLET | Freq: Every day | ORAL | 3 refills | Status: DC

## 2021-11-11 MED ORDER — TRAMADOL HCL 50 MG PO TABS: 100.0000 mg | ORAL_TABLET | Freq: Four times a day (QID) | ORAL | 0 refills | Status: DC

## 2021-11-11 MED ORDER — IBUPROFEN 800 MG PO TABS: 800.0000 mg | ORAL_TABLET | Freq: Three times a day (TID) | ORAL | 3 refills | Status: AC | PRN

## 2021-11-11 MED ORDER — PALBOCICLIB 125 MG PO CAPS
125.0000 mg | ORAL_CAPSULE | Freq: Every day | ORAL | 3 refills | Status: DC
  Filled 2021-11-11: qty 21, 21d supply, fill #0

## 2021-11-12 ENCOUNTER — Other Ambulatory Visit (HOSPITAL_COMMUNITY): Payer: Self-pay

## 2021-11-13 LAB — CEA: CEA: 2 ng/mL (ref 0.0–4.7)

## 2021-11-14 ENCOUNTER — Telehealth: Payer: Self-pay | Admitting: Pharmacy Technician

## 2021-11-14 ENCOUNTER — Telehealth: Payer: Self-pay

## 2021-11-14 ENCOUNTER — Other Ambulatory Visit (HOSPITAL_COMMUNITY): Payer: Self-pay

## 2021-11-14 NOTE — Telephone Encounter (Signed)
Received New start notification for  Ibrance 125mg  . Will update as we work through the benefits process.  Submitted a Prior Authorization request to [facility] for  Ibrance 125mg   via CoverMyMeds. Will update once we receive a response.   (KeyEda Paschal) - [id number]

## 2021-11-14 NOTE — Telephone Encounter (Signed)
Oral Oncology Patient Advocate Encounter  Prior Authorization for Ibrance 125mg  has been approved.    PA# Key: [id number] - PA Case ID: [id number] Effective dates: 11/14/21 through 09/24/22  Patients co-pay is $3,296.38. Will initial patient assistance.  Oral Oncology Clinic will continue to follow.

## 2021-11-14 NOTE — Telephone Encounter (Signed)
Called patient and discussed benefits investigation and patient assistance. Patient will stop by office today to sign application- She has an afternoon doctor's appointment in [place] today and will be in the area- so she may stop by before or after her appt.

## 2021-11-14 NOTE — Progress Notes (Signed)
Sent in application for free Ibrance to [employer].

## 2021-11-14 NOTE — Telephone Encounter (Signed)
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 Medication dose and frequency assessed.  Labs from 11/11/21 assessed, no interventions needed..  Current medication list in Epic reviewed, DDIs with Ibrance identified: - alprazolam: may have increased affects; monitor therapy.  Evaluated chart and no patient barriers to medication adherence noted.   Patient agreement for treatment documented in MD note on 11/11/2021.  Prescription has been e-scribed to the [facility] for benefits analysis and approval.  Oral Oncology Clinic will continue to follow for insurance authorization, copayment issues, initial counseling and start date.  Drema Halon, PharmD Hematology/Oncology Clinical Pharmacist [facility] [phone] 11/14/2021 8:42 AM

## 2021-11-15 LAB — CANCER ANTIGEN 27.29: CA 27.29: 14.6 U/mL (ref 0.0–38.6)

## 2021-11-16 ENCOUNTER — Telehealth: Payer: Self-pay

## 2021-11-16 ENCOUNTER — Other Ambulatory Visit: Payer: Self-pay | Admitting: Hematology and Oncology

## 2021-11-16 MED ORDER — ONDANSETRON HCL 4 MG PO TABS: 4.0000 mg | ORAL_TABLET | Freq: Three times a day (TID) | ORAL | 0 refills | Status: DC | PRN

## 2021-11-16 NOTE — Telephone Encounter (Signed)
Pt has called req something or nausea to be sent in. She started letrozole on Sunday, and nausea started soon after.  Melissa P,NP, sent in zofran. Pt notified.

## 2021-11-17 ENCOUNTER — Ambulatory Visit (HOSPITAL_COMMUNITY)
Admission: RE | Admit: 2021-11-17 | Discharge: 2021-11-17 | Disposition: A | Discharge status: Home / Self Care | Payer: Medicare Other | Source: Ambulatory Visit | Attending: Oncology | Admitting: Oncology

## 2021-11-17 ENCOUNTER — Other Ambulatory Visit: Payer: Self-pay

## 2021-11-17 DIAGNOSIS — C7951 Secondary malignant neoplasm of bone: Secondary | ICD-10-CM | POA: Diagnosis present

## 2021-11-17 LAB — GLUCOSE, CAPILLARY: Glucose-Capillary: 109 mg/dL — ABNORMAL HIGH (ref 70–99)

## 2021-11-17 MED ORDER — FLUDEOXYGLUCOSE F - 18 (FDG) INJECTION
6.9000 | Freq: Once | INTRAVENOUS | Status: AC
  Administered 2021-11-17: 6.67 via INTRAVENOUS

## 2021-11-21 ENCOUNTER — Telehealth: Payer: Self-pay

## 2021-11-21 NOTE — Progress Notes (Signed)
Called and spoke with Tracey Mitchell, patients application for free Tracey Mitchell is still processing.

## 2021-11-21 NOTE — Telephone Encounter (Addendum)
Pt notified of Dr Remi Deter response below. She was thrilled and stated, "thank God". She does not have the Ibrance as of yet.  ----- Message from Derwood Kaplan, MD sent at 11/21/2021 12:24 PM EST ----- Regarding: RE: PET SCAN RESULTS Contact: [phone] We see the area of the hip we knew about, and 1 other small area in the left iliac bone. Otherwise, all is clear! Good news!  I would still rec the chemo pill, palbociclib ----- Message ----- From: Dairl Ponder, RN Sent: 11/21/2021  10:00 AM EST To: Derwood Kaplan, MD Subject: PET SCAN RESULTS                               Pt has called requesting PET scan results.[phone]

## 2021-11-25 NOTE — Telephone Encounter (Signed)
[employer], patient's application is still pending. Application is not missing any information. ? ?Phone# [phone] ?

## 2021-11-27 NOTE — Progress Notes (Incomplete)
[facility]  [address] [place],    [id number] [phone]  Clinic Day:  12/02/2021  Referring physician: Darrol Jump, NP   This document serves as a record of services personally performed by Hosie Poisson, MD. It was created on their behalf by Curry,Lauren E, a trained medical scribe. The creation of this record is based on the scribe's personal observations and the provider's statements to them.  CHIEF COMPLAINT:  CC: History of stage IIA breast cancer with newly found femur lesion for evaluation.  Current Treatment:  Letrozole   HISTORY OF PRESENT ILLNESS:  Tracey Mitchell is a 66 y.o. 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 This was treated with left mastectomy in January 2012, and adjuvant radiation and Arimidex for a total of 4.5 years.  She received Zometa during this time as well due to severe bone loss.  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  She then underwent a left TRAM flap reconstruction, and the scar tissue from this surgery has damaged her nerves, and causes her severe pain.  She states that the pain progressively worsened, but is episodic, and lasts 7-10 minutes.  She has been tried on gabapentin, and pregabalin but these were not tolerated.  She began menarche and age 89, she has had 7 pregnancies with 2 live births, and had her first child at age 54.  She has a hysterectomy at age 37, but ovaries are intact.  She was not placed on hormone replacement therapy. She has severe chronic migraines, up to 10/10, and is on multiple medications to treat this, with a 'cocktail' of baclofen 10 mg, alprazolam 1 mg, diclofenac 50 mg, and tramadol 50 mg daily with some improvement..    Bone density scan from June 2021 revealed improved osteopenia with a T-score of -2.2 of the left femur, previously -2.7.  The AP spine measures  -2.1, previously -2.4.  She received Zometa while on anastrozole, but is now taking [employer] collagen, oral vitamin D, and a daily multivitamin. Currently she is taking a combination of baclofen 10 mg, alprazolam 1 mg, diclofenac 50 mg, and tramadol 50 mg daily for migraines with some improvement.  CT chest from April 2022 revealed no acute intrathoracic pathology. Multiple bilateral ground-glass nodules relatively similar to prior CT. PET revealed multiple small ground-glass opacities in the lungs bilaterally as seen on previous diagnostic chest CTs. None of these demonstrate hypermetabolism although all ower below the accepted threshold for reliable resolution on PET imaging. Additionally, well differentiated/low-grade neoplasm can be poorly FDG avid and continued close attention was recommended. A 1.7 cm lucent and sclerotic lesion in the right femoral neck is hypermetabolic. As a metastatic deposit cannot be excluded, right hip MRI with and without contrast was recommended and revealed enhancing marrow replacing bone lesion located eccentrically within the intertrochanteric aspect of the right femur anteriorly measuring up to 2.0 cm. Linear low signal intensity component within the lesion was suspicious for developing pathologic fracture. 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 She was evaluated by Dr. Magda Bernheim, orthopedic oncologist at [facility]. He feels this may be a healing fracture and is not highly suspicious of malignancy, but will monitor it. There was also nonspecific uptake along the posterior left third rib.   Repeat MRI of the right hip from January 2023 revealed  interval increase in size of the osseous lesion located along the anterior aspect of the right intertrochanteric region from 2.1 cm to 3.4 cm. A smaller additional 8 mm lesion was also noted within the left posterior ilium. These findings were concerning for  an osseous metastases so Dr. Magda Bernheim did pursue a biopsy of the bone. 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 Estrogen receptor was positive at 95% and progesterone receptor was positive at 5%. HER2 was negative (1+) and Ki67 was 15%. We therefore placed her on therapy with anastrozole and palbociclib. PET imaging from February 28th revealed persistent hypermetabolism identified in the right femoral neck lesion. There is also a small hypermetabolic lesion in the posterior left iliac bone. By report, this was visualized on a previous MRI done since 02/08/2021. No evidence for hypermetabolic soft tissue metastases in the chest, abdomen, or pelvis on today's study. Scattered bilateral small ground-glass opacities in the lungs, stable since prior studies. No hypermetabolism on PET imaging today. I met with her after these tests and started her on hormonal therapy with letrozole and recommended that we add palbociclib for improved response. We also discussed bone strengthening medications but she declines Xgeva or Zometa.     INTERVAL HISTORY:  Tracey Mitchell is here for follow up after calling wishing to discuss treatment. She has started letrozole 2.5 mg daily. She states that she has had trouble with severe nausea and vomiting with this medication. She was prescribed ondansetron with good improvement. I advised that she use this as needed throughout the day. She has had some fatigue, which she has adjusted to. We had prescribed palbociclib in addition to the hormonal therapy, however, she is very resistant to start this medication. She has had toxicities with a lot of medications and as this is an oral chemotherapy it concerns her for potential reactions. She has met with Dr. Redmond Pulling, orthopedic oncologist, earlier this month due to her reservations with oral chemotherapy and for his recommendations. He did suggest radiation therapy of the two osseous metastasis and  continuing hormonal therapy. She is planning on moving to [place] by the end of the month and is returning to her oncologist there. She has severe increase in her anxiety and a lot of this is centered around the new chemotherapy medicine that I have recommended. Her  appetite is fair, and her weight is stable since her last visit.  She denies fever, chills or other signs of infection.  She denies bowel issues or abdominal pain.  She denies sore throat, cough, dyspnea, or chest pain.  REVIEW OF SYSTEMS:  Review of Systems  Constitutional:  Positive for fatigue. Negative for appetite change, chills, fever and unexpected weight change.  HENT:  Negative.    Eyes: Negative.   Respiratory: Negative.  Negative for chest tightness, cough, hemoptysis, shortness of breath and wheezing.   Cardiovascular: Negative.  Negative for chest pain, leg swelling and palpitations.  Gastrointestinal:  Positive for nausea (improved with antiemetics) and vomiting (improved with antiemetics). Negative for abdominal distention, abdominal pain, blood in stool, constipation and diarrhea.  Endocrine: Negative.   Genitourinary: Negative.  Negative for difficulty urinating, dysuria, frequency and hematuria.   Musculoskeletal: Negative.  Negative for arthralgias, back pain, flank pain, gait problem and myalgias.  Skin: Negative.   Neurological: Negative.  Negative for dizziness, extremity weakness, gait problem, headaches, light-headedness, numbness, seizures and speech difficulty.  Hematological: Negative.   Psychiatric/Behavioral:  Negative for depression and sleep disturbance. The patient  is nervous/anxious (worsening, with occasional panic).     VITALS:  Blood pressure 131/73, pulse 66, temperature 97.9 F (36.6 C), temperature source Oral, resp. rate 18, height 5' 2"  (1.575 m), weight 140 lb 8 oz (63.7 kg), SpO2 98 %.  Wt Readings from Last 3 Encounters:  12/02/21 140 lb 8 oz (63.7 kg)  11/11/21 139 lb 12.8 oz (63.4 kg)   06/20/21 136 lb 1.6 oz (61.7 kg)    Body mass index is 25.7 kg/m.  Performance status (ECOG): 1 - Symptomatic but completely ambulatory  PHYSICAL EXAM:  Physical Exam deferred to review treatment  LABS:   CBC Latest Ref Rng & Units 12/02/2021 11/11/2021 06/20/2021  WBC - 6.2 7.0 6.4  Hemoglobin 12.0 - 16.0 13.6 13.5 12.7  Hematocrit 36 - 46 42 39 39  Platelets 150 - 400 K/uL 348 319 352   CMP Latest Ref Rng & Units 12/02/2021 11/11/2021 06/20/2021  BUN 4 - 21 18 16 20   Creatinine 0.5 - 1.1 0.7 0.7 0.9  Sodium 137 - 147 138 136(A) 137  Potassium 3.5 - 5.1 mEq/L 4.6 4.5 4.2  Chloride 99 - 108 103 102 103  CO2 13 - 22 28(A) 26(A) 23(A)  Calcium 8.7 - 10.7 9.2 8.9 9.3  Alkaline Phos 25 - 125 72 79 76  AST 13 - 35 29 34 29  ALT 7 - 35 U/L 25 26 16     STUDIES:   EXAM: 11/17/2021 NUCLEAR MEDICINE PET SKULL BASE TO THIGH   TECHNIQUE: 6.7 mCi F-18 FDG was injected intravenously. Full-ring PET imaging was performed from the skull base to thigh after the radiotracer. CT data was obtained and used for attenuation correction and anatomic localization.   Fasting blood glucose: 109 mg/dl   COMPARISON:  Chest CT 09/08/2021.  PET-CT 01/31/2021   FINDINGS: Mediastinal blood pool activity: SUV max 3.1   Liver activity: SUV max NA   NECK: No hypermetabolic lymph nodes in the neck.   Incidental CT findings: none   CHEST: No hypermetabolic mediastinal or hilar nodes. No suspicious pulmonary nodules on the CT scan.   Incidental CT findings: Mild atherosclerotic calcification is noted in the wall of the thoracic aorta. The scattered bilateral ground-glass opacities documented on multiple prior studies are similar in the interval without discernible hypermetabolism on today's exam.   ABDOMEN/PELVIS: No abnormal hypermetabolic activity within the liver, pancreas, adrenal glands, or spleen. No hypermetabolic lymph nodes in the abdomen or pelvis.   Incidental CT findings: There is  mild atherosclerotic calcification of the abdominal aorta without aneurysm.   SKELETON: Hypermetabolism again noted right femoral neck with SUV max = 5.9 today compared to 4.1 previously.   Small focus of FDG accumulation identified in the region of the L5-S1 facets, likely degenerative.   Subtle lucent lesion posterior left iliac bone on 142/4 shows low level hypermetabolism with SUV max = 2.9.   Incidental CT findings: none   IMPRESSION: 1. Persistent hypermetabolism identified in the right femoral neck lesion. There is also a small hypermetabolic lesion in the posterior left iliac bone. By report, this was visualized on a previous MRI done since 02/08/2021. 2. No evidence for hypermetabolic soft tissue metastases in the chest, abdomen, or pelvis on today's study. 3. Scattered bilateral small ground-glass opacities in the lungs, stable since prior studies. No hypermetabolism on PET imaging today. 4.  Aortic Atherosclerois (ICD10-170.0)  HISTORY:   Allergies:  Allergies  Allergen Reactions   Morphine     Other reaction(s): Other (  See Comments) Hyper    Morphine And Related    Sulfa Antibiotics Swelling   Covid-19 (Mrna) Vaccine Rash    Pfizer and J&J   Tape Rash    Current Medications: Current Outpatient Medications  Medication Sig Dispense Refill   BIOTIN 5000 PO Take by mouth.     Cholecalciferol (EQL VITAMIN D3) 50 MCG (2000 UT) CAPS Take by mouth. 4,000 units daily     vitamin C (ASCORBIC ACID) 500 MG tablet Take 1,000 mg by mouth daily.     albuterol (VENTOLIN HFA) 108 (90 Base) MCG/ACT inhaler SMARTSIG:2 Puff(s) By Mouth Every 4-6 Hours PRN     ALPRAZolam (XANAX) 1 MG tablet Take 0.5-1 tablets (0.5-1 mg total) by mouth 3 (three) times daily as needed. 90 tablet 0   baclofen (LIORESAL) 10 MG tablet TAKE 1 TABLET BY MOUTH TWICE A DAY FOR HEADACHES/PAIN     diclofenac (VOLTAREN) 50 MG EC tablet      ibuprofen (ADVIL) 800 MG tablet Take 800 mg by mouth every 8  (eight) hours as needed.     ibuprofen (ADVIL) 800 MG tablet Take 1 tablet (800 mg total) by mouth every 8 (eight) hours as needed. 90 tablet 3   letrozole (FEMARA) 2.5 MG tablet Take 1 tablet (2.5 mg total) by mouth daily. 30 tablet 3   losartan-hydrochlorothiazide (HYZAAR) 100-25 MG tablet TAKE 1 (ONE) TABLET DAILY FOR BLOOD PRESSURE     metoprolol succinate (TOPROL-XL) 100 MG 24 hr tablet Take 100 mg by mouth daily.     Multiple Vitamin (MULTIVITAMIN) capsule Take 1 capsule by mouth daily.     ondansetron (ZOFRAN) 4 MG tablet Take 1 tablet (4 mg total) by mouth every 8 (eight) hours as needed for nausea or vomiting. 90 tablet 5   palbociclib (IBRANCE) 125 MG capsule Take 1 capsule (125 mg total) by mouth daily with breakfast. Take whole with food. Take for 21 days on, 7 days off, repeat every 28 days. 21 capsule 3   prochlorperazine (COMPAZINE) 10 MG tablet Take 1 tablet (10 mg total) by mouth every 6 (six) hours as needed for nausea or vomiting. 60 tablet 5   traMADol (ULTRAM) 50 MG tablet Take 2 tablets (100 mg total) by mouth 4 (four) times daily. 120 tablet 0   No current facility-administered medications for this visit.     ASSESSMENT & PLAN:   Assessment:   Remote history of stage IIA breast cancer diagnosed in 2011 that was treated with left breast mastectomy and adjuvant radiation followed by 4.5 years of arimidex therapy.   Osseous metastasis, January 2023. MRI imaging from January revealed interval increase in the right intertrochanteric lesion now measuring 3.4 cm, previously 2.1 cm, and pathology has confirmed adenocarcinoma, consistent with breast origin. Hormone receptors were positive. A smaller additional 8 mm lesion was also noted within the left posterior ilium. PET imaging confirms these lesions as the only sites of metastatic disease. She has been placed on letrozole 2.5 mg daily as of February and is experiencing some unusual toxicities.   3.   Osteopenia, improved from  osteoporosis. 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 She is taking collagen, oral vitamin D, and a daily multivitamin.  She will be due for repeat bone density in June 2023. If needed, we could consider treatment with miacalcin.  4.   Chronic migraine for the past 12 years. She has been on multiple medications with toxicities. Currently she  is taking a combination of baclofen 10 mg, alprazolam 1 mg, diclofenac 50 mg, and tramadol 50 mg daily with some improvement. We made the referral to a neurologist, Dr. Thornell Mule of [facility] as requested.  5.  History of nonspecific pulmonary nodules. CT chest from December 2022 remained stable. She continues to follow with Dr. Nila Nephew.  6.  Pain of the right lower back and hip which radiates down her right leg. 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 She will continue tramadol 100 mg as needed up to 4 daily and Motrin 800 mg as needed.  7.  Severe anxiety, which has not been well controlled on Xanax 0.5 mg TID. Therefore, I will increase the dose to 1 mg TID, and will send this in today.  8.  Severe nausea, which is controlled with Zofran 4 mg. However, she is only taking this once a day in the mornings. I advised that she continue this throughout the day, and will send in a new prescription today. I will also add in Compazine if needed.    Plan: She comes in today with concerns and anxiety regarding oral chemotherapy with palbociclib. We reviewed her diagnosis and prognosis again today, and I assured her that her prognosis is still very good with continuing hormonal therapy alone. Radiation therapy has been recommended for the right femoral neck and posterior left iliac bone, and I am in agreement. As she is planning on moving to [place] by the end of the month, she will likely pursue this once she has moved. We will not schedule her for further follow up, but  would be glad to see her back if she returns. She will be due for repeat bone density in June 2023. As she has had osteonecrosis of the jaw with Zometa, we would not be able to go with alendronate or denosumab. However, we could consider other medications if needed. She prefers not to take Forteo but is receptive to [employer]. She verbalizes understanding of and agreement to the plans discussed today. She knows to call the office should any new questions or concerns arise.   I provided 30 minutes of face-to-face time during this this encounter and > 50% was spent counseling as documented under my assessment and plan.    I, Rita Ohara, am acting as scribe for Derwood Kaplan, MD  I have reviewed this report as typed by the medical scribe, and it is complete and accurate.

## 2021-11-28 ENCOUNTER — Telehealth: Payer: Self-pay

## 2021-11-28 NOTE — Telephone Encounter (Addendum)
12/02/21 - Pt in to see Dr Hinton Rao this morning. She has decided not ti start the Ibrance right now. She will be moving back to [place] soon. She plans to schedule radiation to the 2 bony lesions when she gets there. No further f/u appt's made with Dr Hinton Rao. ? ? ?11/28/21 I spoke with pt. 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 She asked if she could wait to start until she sees Dr Hinton Rao on Friday, 12/02/2021? ?

## 2021-11-28 NOTE — Telephone Encounter (Signed)
Received fax that patient's Ibrance PAP was approved and medication has shipped. ?

## 2021-11-30 NOTE — Telephone Encounter (Addendum)
Oral Chemotherapy Pharmacist Encounter ? ?I spoke with patient for overview of: Ibrance for the treatment of hormone-receptor positive, HER2 receptor negative breast cancer, in combination with letrozole, planned duration until disease progression or unacceptable toxicity.  ? ?Counseled patient on administration, dosing, side effects, monitoring, drug-food interactions, safe handling, storage, and disposal. ? ?Patient will take Ibrance 19m tablets, 1 tablet by mouth once daily, with or without food, taken for 3 weeks on, 1 week off, and repeated. ? ?Patient knows to avoid grapefruit and grapefruit juice while on treatment with Ibrance. ? ?Patient is taking letrozole once daily at 9am. Patient gets nausea from the letrozole and takes zofran prior to taking the letrozole ? ?Ibrance start date: after discussion with oncologists ? ?Adverse effects include but are not limited to: fatigue, hair loss, GI upset, nausea, decreased blood counts, and increased upper respiratory infections. ?Severe, life-threatening, and/or fatal interstitial lung disease (ILD) and/or pneumonitis may occur with CDK 4/6 inhibitors. ? ?Patient will obtain anti diarrheal and alert the office of 4 or more loose stools above baseline. ? ?Patient reminded of WBC check on Cycle 1 Day 14 for dose and ANC assessment. ? ?Reviewed with patient importance of keeping a medication schedule and plan for any missed doses. No barriers to medication adherence identified. ? ?Medication reconciliation performed and medication/allergy list updated. ? ?IShip brokerfor [facility] Homehas been obtained. ?Patient receives medication through manufacturer assistance, [facility] together. ? ?Patient informed the pharmacy will reach out 5-7 days prior to needing next fill of Ibrance to coordinate continued medication acquisition to prevent break in therapy. ? ?All questions answered. ? ?Mrs. MStruckmanvoiced understanding and appreciation.  ? ?Medication  education handout placed in mail for patient. Patient knows to call the office with questions or concerns. Oral Chemotherapy Clinic phone number provided to patient.  ? ?KDrema Halon PharmD ?Hematology/Oncology Clinical Pharmacist ?[facility]?[phone]?11/30/2021   10:29 AM ? ? ?

## 2021-12-02 ENCOUNTER — Inpatient Hospital Stay: Payer: Medicare Other | Attending: Oncology

## 2021-12-02 ENCOUNTER — Other Ambulatory Visit: Payer: Self-pay

## 2021-12-02 ENCOUNTER — Inpatient Hospital Stay: Payer: Medicare Other | Admitting: Oncology

## 2021-12-02 ENCOUNTER — Encounter: Payer: Self-pay | Admitting: Oncology

## 2021-12-02 ENCOUNTER — Other Ambulatory Visit: Payer: Self-pay | Admitting: Oncology

## 2021-12-02 VITALS — BP 131/73 | HR 66 | Temp 97.9°F | Resp 18 | Ht 62.0 in | Wt 140.5 lb

## 2021-12-02 DIAGNOSIS — M8580 Other specified disorders of bone density and structure, unspecified site: Secondary | ICD-10-CM | POA: Diagnosis not present

## 2021-12-02 DIAGNOSIS — C7951 Secondary malignant neoplasm of bone: Secondary | ICD-10-CM | POA: Diagnosis not present

## 2021-12-02 DIAGNOSIS — Z170 Estrogen receptor positive status [ER+]: Secondary | ICD-10-CM

## 2021-12-02 DIAGNOSIS — C50912 Malignant neoplasm of unspecified site of left female breast: Secondary | ICD-10-CM | POA: Diagnosis not present

## 2021-12-02 DIAGNOSIS — F419 Anxiety disorder, unspecified: Secondary | ICD-10-CM

## 2021-12-02 DIAGNOSIS — Z780 Asymptomatic menopausal state: Secondary | ICD-10-CM

## 2021-12-02 LAB — HEPATIC FUNCTION PANEL
ALT: 25 U/L (ref 7–35)
AST: 29 (ref 13–35)
Alkaline Phosphatase: 72 (ref 25–125)
Bilirubin, Total: 0.5

## 2021-12-02 LAB — CBC AND DIFFERENTIAL
HCT: 42 (ref 36–46)
Hemoglobin: 13.6 (ref 12.0–16.0)
Neutrophils Absolute: 2.98
Platelets: 348 10*3/uL (ref 150–400)
WBC: 6.2

## 2021-12-02 LAB — COMPREHENSIVE METABOLIC PANEL
Albumin: 4.3 (ref 3.5–5.0)
Calcium: 9.2 (ref 8.7–10.7)

## 2021-12-02 LAB — CBC: RBC: 4.64 (ref 3.87–5.11)

## 2021-12-02 LAB — BASIC METABOLIC PANEL
BUN: 18 (ref 4–21)
CO2: 28 — AB (ref 13–22)
Chloride: 103 (ref 99–108)
Creatinine: 0.7 (ref 0.5–1.1)
Glucose: 105
Potassium: 4.6 mEq/L (ref 3.5–5.1)
Sodium: 138 (ref 137–147)

## 2021-12-02 MED ORDER — ONDANSETRON HCL 4 MG PO TABS: 4.0000 mg | ORAL_TABLET | Freq: Three times a day (TID) | ORAL | 5 refills | Status: AC | PRN

## 2021-12-02 MED ORDER — PROCHLORPERAZINE MALEATE 10 MG PO TABS: 10.0000 mg | ORAL_TABLET | Freq: Four times a day (QID) | ORAL | 5 refills | Status: AC | PRN

## 2021-12-02 MED ORDER — ALPRAZOLAM 1 MG PO TABS: 0.5000 mg | ORAL_TABLET | Freq: Three times a day (TID) | ORAL | 0 refills | Status: DC | PRN

## 2021-12-05 ENCOUNTER — Telehealth: Payer: Self-pay

## 2021-12-05 ENCOUNTER — Other Ambulatory Visit: Payer: Self-pay | Admitting: Oncology

## 2021-12-05 DIAGNOSIS — M8580 Other specified disorders of bone density and structure, unspecified site: Secondary | ICD-10-CM

## 2021-12-05 DIAGNOSIS — Z780 Asymptomatic menopausal state: Secondary | ICD-10-CM

## 2021-12-05 DIAGNOSIS — C50912 Malignant neoplasm of unspecified site of left female breast: Secondary | ICD-10-CM

## 2021-12-05 MED ORDER — CALCITONIN (SALMON) 200 UNIT/ACT NA SOLN: 1.0000 | Freq: Every day | NASAL | 12 refills | Status: AC

## 2021-12-05 NOTE — Telephone Encounter (Signed)
-----   Message from Derwood Kaplan, MD sent at 12/05/2021 11:40 AM EDT ----- ?Regarding: med ?She has refused to take the [facility], take it off the med list.  She  hasn't touched the box and plans to bring it in.  Check with [facility] but I doubt we can return it? ? ?

## 2021-12-05 NOTE — Telephone Encounter (Signed)
Instructed patient to take her [facility] that she is not using to [facility] or [facility] in their medication drop off box. Informed patient that due to rules and regulations we can not take it back. She voiced her understanding.  ?

## 2021-12-16 ENCOUNTER — Other Ambulatory Visit: Payer: Self-pay | Admitting: Hematology and Oncology

## 2021-12-16 DIAGNOSIS — C50912 Malignant neoplasm of unspecified site of left female breast: Secondary | ICD-10-CM

## 2021-12-16 DIAGNOSIS — C7951 Secondary malignant neoplasm of bone: Secondary | ICD-10-CM

## 2021-12-20 ENCOUNTER — Other Ambulatory Visit: Payer: Self-pay | Admitting: Hematology and Oncology

## 2021-12-20 ENCOUNTER — Telehealth: Payer: Self-pay

## 2021-12-20 DIAGNOSIS — F419 Anxiety disorder, unspecified: Secondary | ICD-10-CM

## 2021-12-20 MED ORDER — METOPROLOL SUCCINATE ER 100 MG PO TB24: 100.0000 mg | ORAL_TABLET | Freq: Every day | ORAL | 0 refills | Status: AC

## 2021-12-20 MED ORDER — ALPRAZOLAM 1 MG PO TABS: 0.5000 mg | ORAL_TABLET | Freq: Three times a day (TID) | ORAL | 0 refills | Status: AC | PRN

## 2021-12-20 MED ORDER — LOSARTAN POTASSIUM-HCTZ 100-25 MG PO TABS: ORAL_TABLET | ORAL | 0 refills | Status: AC

## 2021-12-20 NOTE — Telephone Encounter (Signed)
Pt went to her PCP appt @ [facility], to only be told that her PCP quit Ken[facility] House[facility] [place]). She was going to see her to get her routine meds filled before she gets to [place]. Pt apologizing, but asking if you could please refill her metoprolol succinate, Hyzaar and alprazolam, then she will establish PCP in [place]. She leaves this Friday. ? ?Dr Hinton Rao @ 245[address], hadn't made decision. ?

## 2022-01-13 ENCOUNTER — Other Ambulatory Visit: Payer: Self-pay | Admitting: Hematology and Oncology

## 2022-02-07 ENCOUNTER — Other Ambulatory Visit: Payer: Self-pay | Admitting: Hematology and Oncology

## 2022-02-07 DIAGNOSIS — C50919 Malignant neoplasm of unspecified site of unspecified female breast: Secondary | ICD-10-CM

## 2022-04-03 ENCOUNTER — Other Ambulatory Visit: Payer: Medicare (Managed Care)

## 2022-04-03 ENCOUNTER — Ambulatory Visit: Payer: Medicare (Managed Care) | Admitting: Oncology

## 2023-06-05 ENCOUNTER — Other Ambulatory Visit: Payer: Self-pay | Admitting: Hematology and Oncology

## 2023-11-14 IMAGING — CT NM PET TUM IMG INITIAL (PI) SKULL BASE T - THIGH
1 of 7 series · 1 of 25 positions shown · non-contrast
Comparison: Chest CT 09/08/2021.  PET-CT 01/31/2021

CLINICAL DATA: Subsequent treatment strategy for breast cancer.
neck biopsy-proven adenocarcinoma consistent with breast primary.

EXAM:
NUCLEAR MEDICINE PET SKULL BASE TO THIGH
TECHNIQUE: 6.7 mCi F-18 FDG was injected intravenously. Full-ring PET imaging
was performed from the skull base to thigh after the radiotracer. CT
data was obtained and used for attenuation correction and anatomic
localization.
Fasting blood glucose: 109 mg/dl

[Series 4: ct sk_thigh 5.0 bf37 · axial · 5.0mm · 0.98mm/px · 1 of 201 slices shown]
[im 201/201  brain]
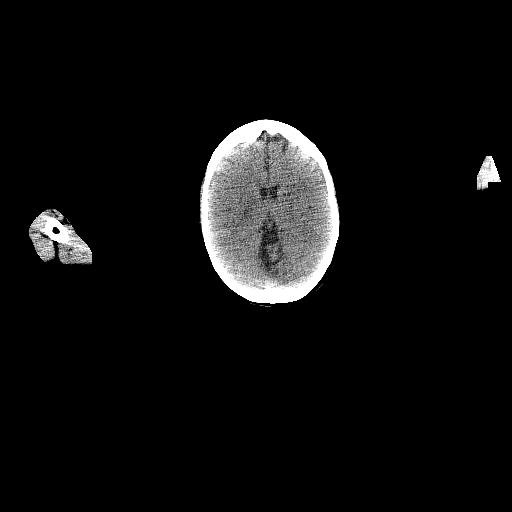

[1 of 25 positions shown; findings below may reference images not displayed]

FINDINGS: Mediastinal blood pool activity: SUV max

Liver activity: SUV max NA

NECK: No hypermetabolic lymph nodes in the neck.

Incidental CT findings: none

CHEST: No hypermetabolic mediastinal or hilar nodes. No suspicious
pulmonary nodules on the CT scan.

Incidental CT findings: Mild atherosclerotic calcification is noted
in the wall of the thoracic aorta. The scattered bilateral
ground-glass opacities documented on multiple prior studies are
similar in the interval without discernible hypermetabolism on
today's exam.

ABDOMEN/PELVIS: No abnormal hypermetabolic activity within the
liver, pancreas, adrenal glands, or spleen. No hypermetabolic lymph
nodes in the abdomen or pelvis.

Incidental CT findings: There is mild atherosclerotic calcification
of the abdominal aorta without aneurysm.

SKELETON: Hypermetabolism again noted right femoral neck with SUV
max = 5.9 today compared to 4.1 previously.

Small focus of FDG accumulation identified in the region of the
L5-S1 facets, likely degenerative.

Subtle lucent lesion posterior left iliac bone on 142/4 shows low
level hypermetabolism with SUV max = 2.9.

Incidental CT findings: none
IMPRESSION: 1. Persistent hypermetabolism identified in the right femoral neck
lesion. There is also a small hypermetabolic lesion in the posterior
left iliac bone. By report, this was visualized on a previous MRI
done since 02/08/2021.
[DATE]. No evidence for hypermetabolic soft tissue metastases in the
chest, abdomen, or pelvis on today's study.
3. Scattered bilateral small ground-glass opacities in the lungs,
stable since prior studies. No hypermetabolism on PET imaging today.
4.  Aortic Atherosclerois (Q4GCR-170.0)

## 2023-11-24 DEATH — deceased
